# Patient Record
Sex: Male | Born: 1950 | Race: White | Hispanic: No | Marital: Single | Smoking: Never smoker
Health system: Southern US, Community
[De-identification: ages and names within clinical notes are randomized; demographics above are authoritative.]

## PROBLEM LIST (undated history)

## (undated) DIAGNOSIS — F329 Major depressive disorder, single episode, unspecified: Secondary | ICD-10-CM

## (undated) DIAGNOSIS — F32A Depression, unspecified: Secondary | ICD-10-CM

## (undated) DIAGNOSIS — N200 Calculus of kidney: Secondary | ICD-10-CM

## (undated) DIAGNOSIS — Z8249 Family history of ischemic heart disease and other diseases of the circulatory system: Secondary | ICD-10-CM

## (undated) DIAGNOSIS — E785 Hyperlipidemia, unspecified: Secondary | ICD-10-CM

## (undated) DIAGNOSIS — F988 Other specified behavioral and emotional disorders with onset usually occurring in childhood and adolescence: Secondary | ICD-10-CM

## (undated) DIAGNOSIS — K219 Gastro-esophageal reflux disease without esophagitis: Secondary | ICD-10-CM

## (undated) HISTORY — DX: Gastro-esophageal reflux disease without esophagitis: K21.9

## (undated) HISTORY — DX: Hyperlipidemia, unspecified: E78.5

## (undated) HISTORY — DX: Family history of ischemic heart disease and other diseases of the circulatory system: Z82.49

## (undated) HISTORY — DX: Calculus of kidney: N20.0

## (undated) HISTORY — DX: Depression, unspecified: F32.A

## (undated) HISTORY — DX: Major depressive disorder, single episode, unspecified: F32.9

## (undated) HISTORY — DX: Other specified behavioral and emotional disorders with onset usually occurring in childhood and adolescence: F98.8

---

## 2006-02-22 ENCOUNTER — Ambulatory Visit: Payer: Self-pay | Admitting: Family Medicine

## 2006-12-28 ENCOUNTER — Encounter: Admission: RE | Admit: 2006-12-28 | Discharge: 2006-12-28 | Payer: Self-pay | Admitting: Chiropractic Medicine

## 2007-04-06 HISTORY — PX: COLONOSCOPY: SHX174

## 2007-04-06 LAB — HM COLONOSCOPY: HM Colonoscopy: NORMAL

## 2007-12-22 ENCOUNTER — Ambulatory Visit: Payer: Self-pay | Admitting: Family Medicine

## 2008-04-04 ENCOUNTER — Ambulatory Visit: Payer: Self-pay | Admitting: *Deleted

## 2008-04-04 ENCOUNTER — Emergency Department (HOSPITAL_COMMUNITY): Admission: EM | Admit: 2008-04-04 | Discharge: 2008-04-04 | Payer: Self-pay | Admitting: Emergency Medicine

## 2008-04-08 ENCOUNTER — Ambulatory Visit: Payer: Self-pay | Admitting: Family Medicine

## 2008-04-22 ENCOUNTER — Ambulatory Visit: Payer: Self-pay | Admitting: Family Medicine

## 2008-05-06 ENCOUNTER — Ambulatory Visit: Payer: Self-pay | Admitting: Family Medicine

## 2008-05-13 ENCOUNTER — Ambulatory Visit: Payer: Self-pay | Admitting: Family Medicine

## 2008-07-10 ENCOUNTER — Ambulatory Visit: Payer: Self-pay | Admitting: Family Medicine

## 2008-09-20 ENCOUNTER — Ambulatory Visit: Payer: Self-pay | Admitting: Family Medicine

## 2008-09-23 ENCOUNTER — Ambulatory Visit: Payer: Self-pay | Admitting: Family Medicine

## 2008-09-23 ENCOUNTER — Encounter: Admission: RE | Admit: 2008-09-23 | Discharge: 2008-09-23 | Payer: Self-pay | Admitting: Family Medicine

## 2008-10-01 ENCOUNTER — Encounter: Payer: Self-pay | Admitting: Sports Medicine

## 2008-10-02 ENCOUNTER — Ambulatory Visit: Payer: Self-pay | Admitting: Sports Medicine

## 2008-10-02 DIAGNOSIS — M2170 Unequal limb length (acquired), unspecified site: Secondary | ICD-10-CM | POA: Insufficient documentation

## 2008-10-02 DIAGNOSIS — M25559 Pain in unspecified hip: Secondary | ICD-10-CM | POA: Insufficient documentation

## 2008-11-26 ENCOUNTER — Ambulatory Visit: Payer: Self-pay | Admitting: Family Medicine

## 2008-12-12 ENCOUNTER — Ambulatory Visit: Payer: Self-pay | Admitting: Family Medicine

## 2008-12-23 ENCOUNTER — Ambulatory Visit: Payer: Self-pay | Admitting: Family Medicine

## 2009-01-02 ENCOUNTER — Ambulatory Visit (HOSPITAL_COMMUNITY): Admission: RE | Admit: 2009-01-02 | Discharge: 2009-01-02 | Payer: Self-pay | Admitting: Family Medicine

## 2009-01-02 ENCOUNTER — Ambulatory Visit: Payer: Self-pay | Admitting: Family Medicine

## 2009-01-06 ENCOUNTER — Ambulatory Visit: Payer: Self-pay | Admitting: Family Medicine

## 2009-01-06 ENCOUNTER — Ambulatory Visit (HOSPITAL_COMMUNITY): Admission: RE | Admit: 2009-01-06 | Discharge: 2009-01-06 | Payer: Self-pay | Admitting: Orthopedic Surgery

## 2009-01-14 ENCOUNTER — Ambulatory Visit: Payer: Self-pay | Admitting: Infectious Disease

## 2009-01-14 ENCOUNTER — Ambulatory Visit (HOSPITAL_COMMUNITY): Admission: RE | Admit: 2009-01-14 | Discharge: 2009-01-15 | Payer: Self-pay | Admitting: Orthopedic Surgery

## 2009-01-15 ENCOUNTER — Ambulatory Visit: Payer: Self-pay | Admitting: Infectious Disease

## 2009-01-17 ENCOUNTER — Encounter: Payer: Self-pay | Admitting: Infectious Disease

## 2009-01-21 ENCOUNTER — Encounter: Payer: Self-pay | Admitting: Infectious Disease

## 2009-01-22 ENCOUNTER — Telehealth: Payer: Self-pay | Admitting: Infectious Disease

## 2009-01-23 ENCOUNTER — Encounter: Payer: Self-pay | Admitting: Infectious Disease

## 2009-01-24 ENCOUNTER — Encounter: Payer: Self-pay | Admitting: Infectious Disease

## 2009-01-28 ENCOUNTER — Encounter: Payer: Self-pay | Admitting: Infectious Disease

## 2009-02-04 ENCOUNTER — Encounter: Payer: Self-pay | Admitting: Infectious Disease

## 2009-02-17 ENCOUNTER — Ambulatory Visit: Payer: Self-pay | Admitting: Family Medicine

## 2009-02-24 ENCOUNTER — Ambulatory Visit: Payer: Self-pay | Admitting: Infectious Disease

## 2009-02-24 DIAGNOSIS — M869 Osteomyelitis, unspecified: Secondary | ICD-10-CM | POA: Insufficient documentation

## 2009-02-24 DIAGNOSIS — I498 Other specified cardiac arrhythmias: Secondary | ICD-10-CM

## 2009-02-24 DIAGNOSIS — L270 Generalized skin eruption due to drugs and medicaments taken internally: Secondary | ICD-10-CM | POA: Insufficient documentation

## 2009-12-20 IMAGING — CR DG CHEST 1V PORT
1 series · 1 of 1 positions shown · non-contrast
Comparison: 04/04/2008

CLINICAL DATA: PICC line placement

PORTABLE CHEST - 1 VIEW

[AP]
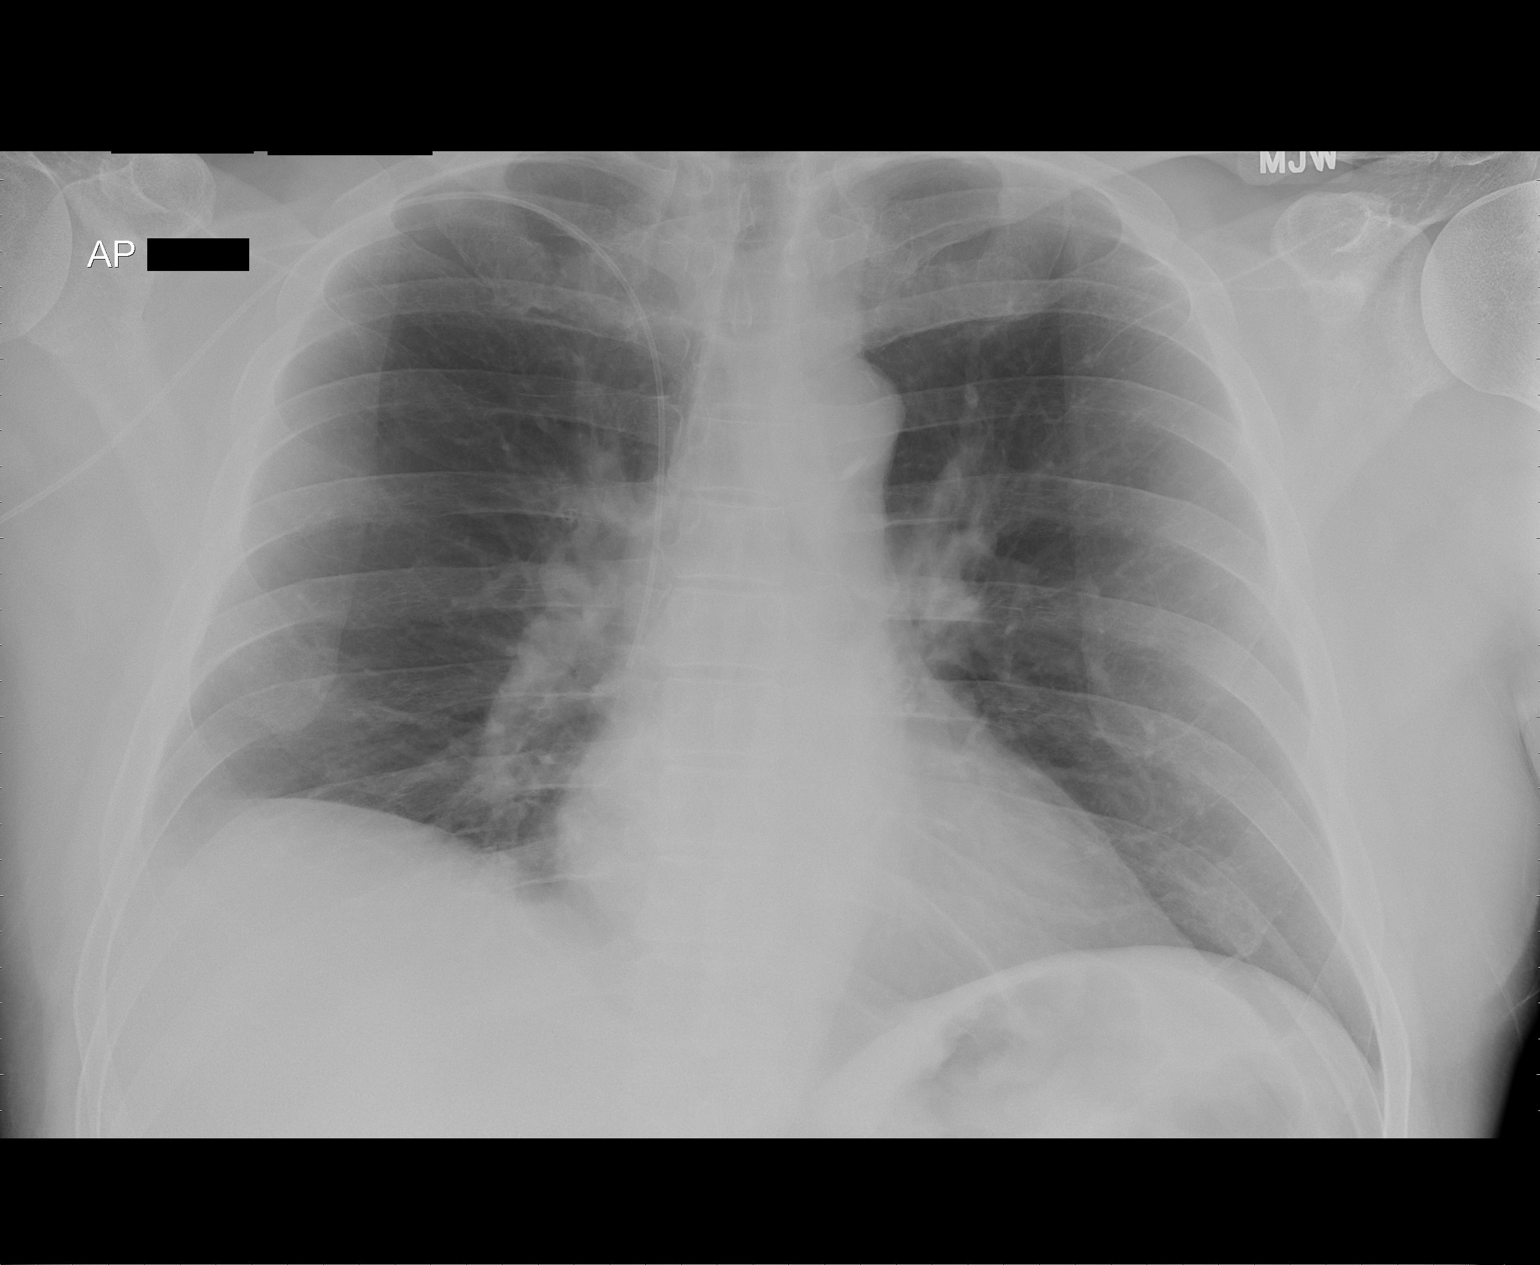

[1 of 1 positions shown; findings below may reference images not displayed]

FINDINGS: Right-sided PICC line has been placed with its tip at the
cavoatrial junction.  Heart size is normal.  Lungs are clear.  No
pneumothorax.  No pleural effusion.
IMPRESSION: Right-sided PICC line tip at cavoatrial junction.  No pneumothorax.

## 2010-04-26 ENCOUNTER — Encounter: Payer: Self-pay | Admitting: Family Medicine

## 2010-04-27 ENCOUNTER — Encounter: Payer: Self-pay | Admitting: Family Medicine

## 2010-07-09 LAB — COMPREHENSIVE METABOLIC PANEL
ALT: 19 U/L (ref 0–53)
AST: 18 U/L (ref 0–37)
CO2: 28 mEq/L (ref 19–32)
Calcium: 9.2 mg/dL (ref 8.4–10.5)
GFR calc Af Amer: >60 mL/min (ref >60)
Glucose, Bld: 102 mg/dL — ABNORMAL HIGH (ref 70–99)
Sodium: 141 mEq/L (ref 135–145)
Total Bilirubin: 0.8 mg/dL (ref 0.3–1.2)

## 2010-07-09 LAB — DIFFERENTIAL
Basophils Absolute: 0 10*3/uL (ref 0.0–0.1)
Basophils Relative: 0 % (ref 0–1)
Lymphocytes Relative: 23 % (ref 12–46)
Lymphs Abs: 2 10*3/uL (ref 0.7–4.0)
Monocytes Absolute: 0.6 10*3/uL (ref 0.1–1.0)
Neutro Abs: 5.9 10*3/uL (ref 1.7–7.7)
Neutrophils Relative %: 69 % (ref 43–77)

## 2010-07-09 LAB — ANAEROBIC CULTURE: Gram Stain: NONE SEEN

## 2010-07-09 LAB — CBC
Hemoglobin: 14.3 g/dL (ref 13.0–17.0)
Platelets: 170 10*3/uL (ref 150–400)
RDW: 13 % (ref 11.5–15.5)

## 2010-07-09 LAB — TISSUE CULTURE

## 2010-07-09 LAB — HIV-1 RNA QUANT-NO REFLEX-BLD
HIV 1 RNA Quant: <48 copies/mL (ref <48)
HIV-1 RNA Quant, Log: <1.68 {Log} (ref <1.68)

## 2010-07-09 LAB — AFB CULTURE WITH SMEAR (NOT AT ARMC)
Acid Fast Smear: NONE SEEN
Acid Fast Smear: NONE SEEN

## 2010-07-09 LAB — SEDIMENTATION RATE: Sed Rate: 9 mm/hr (ref 0–16)

## 2010-08-18 NOTE — Consult Note (Signed)
Kevin Harrington, CHAIRES NO.:  [id number]   MEDICAL RECORD NO.:  [id number]          PATIENT TYPE:  EMS   LOCATION:  [facility]                         FACILITY:  [facility]   PHYSICIAN:  Adela Ports, MD   DATE OF BIRTH:  05/15/1950   DATE OF CONSULTATION:  04/04/2008  DATE OF DISCHARGE:                                 CONSULTATION   PRIMARY CARE PHYSICIAN:  Sharlot Gowda, M.D.   REQUESTING PHYSICIAN:  Bethann Berkshire, M.D. a physician in the [facility]  emergency department.   CHIEF COMPLAINT:  Chest pain.   HISTORY OF THE PRESENT ILLNESS:  The patient is a 60 year old male with  a history of gastroesophageal reflux disease, dyslipidemia and a family  history of coronary disease.  He takes Protonix regularly for his GERD  and his prescription ran out about 3 days ago.  This afternoon he began  having increasing GERD symptoms in the afternoon and then at about 3:00  P.M. developed left chest pressure that was squeezing in nature.  He did  not any radiation, nausea, vomiting, diaphoresis, or short or shortness  of breath associated with this.  It was not associated with any position  and did not improve with changing positions.  It was not associated with  deep breaths or palpation.  He denies any dyspnea on exertion,  orthopnea, PND, edema, palpitations, presyncope or syncope, nor cough or  wheezing.  He has not had fever, chills or recent URI symptoms.   After this the squeezing chest pressure persisted for about 3 hours.  He  contacted his primary doctor in [facility] and was advised to come  into the emergency department for further evaluation.  In the emergency  department he received nitroglycerin x3 without improvement.  He also  received aspirin and Protonix.  Eventually his chest squeezing sensation  resolved thought he is not sure which interaction, if any, was  association with the improvement.  Currently he is chest pain-free.   PAST MEDICAL HISTORY:  1.  Dyslipidemia, currently on Lipitor.  2. Gastroesophageal reflux disease, treated with Protonix.  3. ADHD.  4. History of atypical chest pain a number of years ago with negative      stress test x3.  After continued chest discomfort despite negative      stress test he underwent cardiac catheterization in 2004, which was      reportedly negative.  He states that his chest discomfort at that      point was different than the chest pain that he felt today.   SOCIAL HISTORY:  The patient lives in [place].  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  He does not smoke, drink significantly or use any  drugs.   FAMILY HISTORY:  The family history is notable for a myocardial  infarction in his father at the age of 8; but, his mother and father  died of strokes.  He has two brothers neither of whom has significant  coronary disease.   REVIEW OF SYSTEMS:  The review of systems is as above; otherwise, he  denies his  review of systems is negative in detail.  Specifically he  denies fevers, chills, sweats, weight changes, adenopathy, shortness of  breath, dyspnea on exertion, orthopnea, PND, edema, palpitations,  presyncope, claudication, cough or wheezing, arthralgias or myalgias,  nausea, vomiting, diaphoresis, hematemesis, bright red blood per rectum,  dysphagia, odynophagia, or abdominal pain.  He does report that he often  gets GERD symptoms if he does not take his Protonix for 2 or 3 days.   CODE STATUS:  The patient's Code Status is full.   ALLERGIES:  MORPHINE CAUSES NAUSEA AND VOMITING.   MEDICATIONS:  1. Protonix 40 mg by mouth daily.  2. Lexapro daily.  3. Focalin daily.  4. Lipitor 20 mg daily.   PHYSICAL EXAMINATION:  VITAL SIGNS:  Temperature 98.5, pulse 62,  respiratory rate 16, blood pressure 123/76, and oxygen saturation 98% on  room air.  GENERAL APPEARANCE:  In general he is alert and oriented x3, and in no  acute distress.  HEENT:  Normocephalic and atraumatic head.   Pupils are equal, round,  react to light and accommodation.  Extraocular movements are intact.  Sclerae are clear.  NECK:  The neck is supple without lymphadenopathy, thyromegaly or  bruits.  JVD is 7 cmH2O with no evidence of Kussmaul sign.  HEART:  Cardiovascular exam - regular rate and rhythm.  Normal S1 and  S2.  No S3 or S4.  There are no murmurs appreciated and no rubs  appreciated.  Pulses are 2+ and equal without bruits bilaterally.  LUNGS:  The lungs are clear to auscultation bilaterally without  wheezing, rhonchi or rales.  SKIN:  The skin is without rash or lesions.  ABDOMEN:  The abdomen is soft, nontender and nondistended.  No rebound  or guarding.  Positive bowel sounds.  EXTREMITIES:  The extremities reveal no clubbing, cyanosis or edema.  MUSCULOSKELETAL:  The musculoskeletal exam is normal.  NEUROLOGIC:  Neuro exam is normal.   RADIOLOGIC DATA:  Chest x-ray showed no acute process.  EKG showed  normal sinus rhythm at a rate of 71, axis of 98 degrees, PR interval 24,  QRS  108 mL, QTC 435, there are some nonsignificant inferior Q-waves,  and there are also some nonspecific PR depressions and borderline  diffuse ST elevations raising the question of pericarditis or  nondiagnostic.  There is no prior EKG for comparison.   LABORATORY DATA:  The labs are notable for a white count of 13.1 with  62% polys, 26% lymphocytes, 9% monos, and hematocrit and platelets were  normal.  Metabolic panel including liver tests are quite unremarkable.  D-dimer was less than assay.  Lipase was normal.  Cardiac enzymes at  8:00 P.M. and at 9:50 P.M. were normal with MB 1.1 and 1.4, troponins  less than assay and myoglobins 59 and 57, which were both normal.   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  He has had negative  enzymes times two with the  most recent set being st least 6 or 7 hours  after the onset of his symptoms.  He has no electrocardiographic changes  suggestive of an acute coronary syndrome.  I suspect that this is likely  gastroesophageal reflux disease after stopping his Protonix.  My  suspicion for acute coronary syndrome is quite low.  He does have a  mildly elevated white blood count and some  borderline  electrocardiographic changes raising the question of pericarditis.  I  discussed this with him and recommended that he use aspirin for  recurrent chest pain if it is mild.  He has no evidence of volume  overload or tamponade clinically, and no rub on examination.    After discussion with the patient I feel he is safe for discharge.  He  is aware of symptoms to watch for; and, has stated that he will return  to the emergency department if he has increasing symptoms.  His primary  care doctor is aware that he came in tonight and the patient will  contact his primary care physician in the morning to discuss  scheduling a stress test given his history of cardiac risk factors, and  the fact that it has been more than 5 years since his cardiac  catheterization.  If he does return and especially if he develops any  shortness of breath or other symptoms, then an echocardiogram would be  reasonable to evaluate for possible complicated pericarditis.      Adela Ports, MD  Electronically Signed     DWM/MEDQ  D:  04/04/2008  T:  04/05/2008  Job:  [id number]

## 2011-01-08 LAB — CBC
MCHC: 33 g/dL (ref 30.0–36.0)
RBC: 5.02 MIL/uL (ref 4.22–5.81)
RDW: 13.3 % (ref 11.5–15.5)

## 2011-01-08 LAB — POCT CARDIAC MARKERS
CKMB, poc: 1.1 ng/mL (ref 1.0–8.0)
CKMB, poc: 1.4 ng/mL (ref 1.0–8.0)
Myoglobin, poc: 57.1 ng/mL (ref 12–200)
Troponin i, poc: <0.05 ng/mL (ref 0.00–0.09)

## 2011-01-08 LAB — COMPREHENSIVE METABOLIC PANEL
ALT: 27 U/L (ref 0–53)
AST: 23 U/L (ref 0–37)
CO2: 27 mEq/L (ref 19–32)
Calcium: 9.2 mg/dL (ref 8.4–10.5)
GFR calc Af Amer: >60 mL/min (ref >60)
Sodium: 137 mEq/L (ref 135–145)
Total Protein: 7.4 g/dL (ref 6.0–8.3)

## 2011-01-08 LAB — DIFFERENTIAL
Eosinophils Absolute: 0.2 10*3/uL (ref 0.0–0.7)
Eosinophils Relative: 2 % (ref 0–5)
Lymphs Abs: 3.4 10*3/uL (ref 0.7–4.0)
Monocytes Absolute: 1.2 10*3/uL — ABNORMAL HIGH (ref 0.1–1.0)
Monocytes Relative: 9 % (ref 3–12)

## 2011-07-14 ENCOUNTER — Encounter: Payer: Self-pay | Admitting: Internal Medicine

## 2011-07-15 ENCOUNTER — Ambulatory Visit (INDEPENDENT_AMBULATORY_CARE_PROVIDER_SITE_OTHER): Payer: BC Managed Care – PPO | Admitting: Family Medicine

## 2011-07-15 ENCOUNTER — Encounter: Payer: Self-pay | Admitting: Family Medicine

## 2011-07-15 VITALS — BP 130/80 | HR 78 | Temp 97.9°F | Resp 16 | Wt 187.0 lb

## 2011-07-15 DIAGNOSIS — E291 Testicular hypofunction: Secondary | ICD-10-CM

## 2011-07-15 DIAGNOSIS — N289 Disorder of kidney and ureter, unspecified: Secondary | ICD-10-CM

## 2011-07-15 DIAGNOSIS — D518 Other vitamin B12 deficiency anemias: Secondary | ICD-10-CM

## 2011-07-15 DIAGNOSIS — D531 Other megaloblastic anemias, not elsewhere classified: Secondary | ICD-10-CM

## 2011-07-15 NOTE — Progress Notes (Signed)
  Subjective:    Patient ID: Kevin Harrington, male    DOB: Mar 02, 1951, 61 y.o.   MRN: [id number]  HPI He is here for an interval evaluation. He has been living in [place] and recently moved back to this area. He was diagnosed with hypogonadism and had been on medication. He needs followup on this. He also was diagnosed with a vitamin B12 deficiency and was given injections. Recent blood work from December did show a slightly elevated creatinine as well as BUN. He thinks this could be more dehydration related. He also has stopped taking his Focalin as well as antidepressants and states that he feels that he is doing fairly well with this. 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   Review of Systems     Objective:   Physical Exam Alert and in no distress with appropriate affect and appropriately dressed       Assessment & Plan:   1. Hypogonadism male  Testosterone  2. Vitamin B12 deficient megaloblastic anemia  Vitamin B12  3. Abnormal renal function  Comprehensive metabolic panel   discussed the fact that the B12 can be taken orally. We will also reevaluate testosterone and look at his renal functions.

## 2011-07-16 LAB — COMPREHENSIVE METABOLIC PANEL
AST: 16 U/L (ref 0–37)
Alkaline Phosphatase: 91 U/L (ref 39–117)
BUN: 21 mg/dL (ref 6–23)
Calcium: 9.4 mg/dL (ref 8.4–10.5)
Chloride: 100 mEq/L (ref 96–112)
Creat: 1.2 mg/dL (ref 0.50–1.35)

## 2011-08-31 ENCOUNTER — Ambulatory Visit (INDEPENDENT_AMBULATORY_CARE_PROVIDER_SITE_OTHER): Payer: BC Managed Care – PPO | Admitting: Family Medicine

## 2011-08-31 ENCOUNTER — Encounter: Payer: Self-pay | Admitting: Family Medicine

## 2011-08-31 ENCOUNTER — Telehealth: Payer: Self-pay | Admitting: Internal Medicine

## 2011-08-31 VITALS — BP 138/72 | HR 98 | Wt 186.0 lb

## 2011-08-31 DIAGNOSIS — I10 Essential (primary) hypertension: Secondary | ICD-10-CM

## 2011-08-31 DIAGNOSIS — Z79899 Other long term (current) drug therapy: Secondary | ICD-10-CM

## 2011-08-31 DIAGNOSIS — Z8739 Personal history of other diseases of the musculoskeletal system and connective tissue: Secondary | ICD-10-CM

## 2011-08-31 DIAGNOSIS — R350 Frequency of micturition: Secondary | ICD-10-CM

## 2011-08-31 DIAGNOSIS — E291 Testicular hypofunction: Secondary | ICD-10-CM

## 2011-08-31 DIAGNOSIS — N200 Calculus of kidney: Secondary | ICD-10-CM

## 2011-08-31 DIAGNOSIS — Z8639 Personal history of other endocrine, nutritional and metabolic disease: Secondary | ICD-10-CM

## 2011-08-31 DIAGNOSIS — H9190 Unspecified hearing loss, unspecified ear: Secondary | ICD-10-CM

## 2011-08-31 LAB — POCT URINALYSIS DIPSTICK
Protein, UA: NEGATIVE
Spec Grav, UA: 1.02
Urobilinogen, UA: NEGATIVE
pH, UA: 5

## 2011-08-31 LAB — TESTOSTERONE: Testosterone: 148.15 ng/dL — ABNORMAL LOW (ref 300–890)

## 2011-08-31 MED ORDER — TERAZOSIN HCL 1 MG PO CAPS: 1.0000 mg | ORAL_CAPSULE | Freq: Every day | ORAL | Status: DC

## 2011-08-31 MED ORDER — TESTOSTERONE 20.25 MG/1.25GM (1.62%) TD GEL: 2.0000 | Freq: Every day | TRANSDERMAL | Status: DC

## 2011-08-31 MED ORDER — TESTOSTERONE 50 MG/5GM (1%) TD GEL: 5.0000 g | Freq: Every day | TRANSDERMAL | Status: DC

## 2011-08-31 NOTE — Telephone Encounter (Signed)
Call in the AndroGel 2 pumps daily.

## 2011-08-31 NOTE — Patient Instructions (Signed)
Tried a new medicine and let me know how works. Stay on the testosterone. Call me if you need your gout medicine

## 2011-08-31 NOTE — Telephone Encounter (Signed)
Called androgel in per jcl 

## 2011-08-31 NOTE — Progress Notes (Signed)
  Subjective:    Patient ID: Kevin Harrington, male    DOB: [birth year], 61 y.o.   MRN: [id number]  HPI He is here for an interval evaluation. He does complain of difficulty with urinary frequency as well as urgency but no hesitancy, decreased stream. He has nocturia times one. He has a previous history of renal stones and presently is on HCTZ to help with this. He is also noted decreased hearing in the left ear but no pain. He is also had intermittent abdominal distress mainly in the morning. Presently he is on Protonix. He was diagnosed recently with a gout attack and given colchicine which did take his symptoms away fairly quickly. He has been using Testim but in very small doses making one tube last an entire week.   Review of Systems     Objective:   Physical Exam alert and in no distress. Tympanic membranes and canals are normal. Throat is clear. Tonsils are normal. Neck is supple without adenopathy or thyromegaly. Cardiac exam shows a regular sinus rhythm without murmurs or gallops. Lungs are clear to auscultation. Rectal exam shows a normal sized prostate. Hearing screening was negative. Urinalysis normal.        Assessment & Plan:   1. Frequency of micturition  terazosin (HYTRIN) 1 MG capsule, POCT Urinalysis Dipstick  2. Hypogonadism male  Testosterone, PSA, DISCONTINUED: testosterone (TESTIM) 50 MG/5GM GEL  3. History of gout    4. Hypertension    5. Decreased hearing    6. Encounter for long-term (current) use of other medications    7. Renal stones    8    GERD                                                                continue Protonix and monitor the abdominal distress. He is to call me and let he know how the Hytrin is working. Continue on all of the medications. He has a gout attack, he will call. His insurance will apparently not covered tested and he will be switched to AndroGel.

## 2011-10-22 ENCOUNTER — Other Ambulatory Visit: Payer: Self-pay | Admitting: Family Medicine

## 2011-10-22 MED ORDER — TERAZOSIN HCL 2 MG PO CAPS: 2.0000 mg | ORAL_CAPSULE | Freq: Every day | ORAL | Status: DC

## 2011-12-24 ENCOUNTER — Telehealth: Payer: Self-pay | Admitting: Family Medicine

## 2011-12-24 NOTE — Telephone Encounter (Signed)
LM

## 2012-01-10 ENCOUNTER — Encounter: Payer: Self-pay | Admitting: Family Medicine

## 2012-01-10 ENCOUNTER — Ambulatory Visit (INDEPENDENT_AMBULATORY_CARE_PROVIDER_SITE_OTHER): Payer: BC Managed Care – PPO | Admitting: Family Medicine

## 2012-01-10 VITALS — BP 126/78 | HR 106 | Wt 184.0 lb

## 2012-01-10 DIAGNOSIS — Z2911 Encounter for prophylactic immunotherapy for respiratory syncytial virus (RSV): Secondary | ICD-10-CM

## 2012-01-10 DIAGNOSIS — F329 Major depressive disorder, single episode, unspecified: Secondary | ICD-10-CM

## 2012-01-10 DIAGNOSIS — E785 Hyperlipidemia, unspecified: Secondary | ICD-10-CM

## 2012-01-10 DIAGNOSIS — R079 Chest pain, unspecified: Secondary | ICD-10-CM

## 2012-01-10 DIAGNOSIS — Z23 Encounter for immunization: Secondary | ICD-10-CM

## 2012-01-10 LAB — LIPID PANEL
Cholesterol: 189 mg/dL (ref 0–200)
LDL Cholesterol: 119 mg/dL — ABNORMAL HIGH (ref 0–99)
VLDL: 33 mg/dL (ref 0–40)

## 2012-01-10 MED ORDER — ATORVASTATIN CALCIUM 20 MG PO TABS: 20.0000 mg | ORAL_TABLET | Freq: Every day | ORAL | Status: DC

## 2012-01-10 NOTE — Patient Instructions (Addendum)
Hold both the lisinopril and Hytrin and keep track of your blood pressure and then let me know

## 2012-01-10 NOTE — Progress Notes (Signed)
  Subjective:    Patient ID: Kevin Harrington, male    DOB: 1950/07/24, 61 y.o.   MRN: [id number]  HPI He is here for consultation concerning chest pain. This has been going on for several months and is intermittent in nature. He describes it as aching but no associated shortness of breath, diaphoresis or weakness. Specifically he can use the elliptical machine without difficulty. He has a previous history of difficulty with chest pain and in the past has had a stress test which was negative as well as catheterization in 2000 which was negative. Family history positive for father having an MI at age 12. He cannot relate this distress, eating or position. On September 15 he woke up with a severe left temporal headache and did go to the emergency room. The blood work and CT scan were reviewed. Blood work showed slight hypokalemia at 3.2 and the CT did show some mild atrophy. He has not had any symptoms since then. Several years ago he was placed on lisinopril/HCTZ. His readings in were 140/80. Since then he has noted polyuria and would like to stop the medicine. He also recently stopped taking Hytrin. He is also scheduled to see a psychiatrist in [place]. He is still having suicidal ideation.   Review of Systems  Constitutional: Negative.   HENT: Negative.   Respiratory: Negative.   Cardiovascular: Positive for chest pain.  Gastrointestinal: Negative.   Musculoskeletal: Negative.   Skin: Negative.   Hematological: Negative.   Psychiatric/Behavioral: Positive for suicidal ideas. The patient is nervous/anxious.        Objective:   Physical Exam alert and in no distress. Tympanic membranes and canals are normal. Throat is clear. Tonsils are normal. Neck is supple without adenopathy or thyromegaly. Cardiac exam shows a regular sinus rhythm without murmurs or gallops. Lungs are clear to auscultation. Abdominal exam shows no asses or tenderness with normal bowel sounds. DTRs normal.         Assessment & Plan:   1. Need for prophylactic vaccination and inoculation against influenza  Flu vaccine greater than or equal to 3yo preservative free IM  2. Hyperlipidemia LDL goal < 100  Lipid panel, atorvastatin (LIPITOR) 20 MG tablet  3. Chest pain    4. Depression     flu shot and Zostavax given. Discussed possible side effects and benefits. He is to see cardiology today. Did recommend that once he gets stable on the medication from a psychiatrist, I will continue him on this. He will also let me know his blood pressure readings since we are stopping these medicines.

## 2012-01-11 ENCOUNTER — Other Ambulatory Visit: Payer: Self-pay | Admitting: Cardiology

## 2012-01-11 DIAGNOSIS — I7090 Unspecified atherosclerosis: Secondary | ICD-10-CM

## 2012-01-11 DIAGNOSIS — E785 Hyperlipidemia, unspecified: Secondary | ICD-10-CM

## 2012-01-11 DIAGNOSIS — K219 Gastro-esophageal reflux disease without esophagitis: Secondary | ICD-10-CM

## 2012-01-11 NOTE — Progress Notes (Signed)
Kevin Harrington    Date of visit:  01/10/2012 DOB:  1950/08/25    Age:  61 yrs. Medical record number:  [id number]     Account number:  [id number] Primary Care Provider: Sharlot Gowda C ____________________________ CURRENT DIAGNOSES  1. Chest Pain  2. Hyperlipidemia  3. Atherosclerosis Vascular Disease  4. GERD ____________________________ ALLERGIES  Compazine  doxycycline hyclate  morphine, Nausea  Vancomycin ____________________________ MEDICATIONS  1. Lipitor 20 mg tablet, 1 p.o. daily  2. Protonix 40 mg tablet,delayed release (DR/EC), 1 p.o. daily  3. aspirin 81 mg tablet, chewable, 1 p.o. daily ____________________________ CHIEF COMPLAINTS  Chest pain ____________________________ HISTORY OF PRESENT ILLNESS  Patient seen at the request of Dr. Sharlot Gowda for evaluation of chest pain. 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 He had a catheterization done several years ago when he was living in [place] that was reportedly negative. He had an episode of syncope previously and had a tilt table and had a stress echo that was done several years ago because of some chest pain in [place]. He has had mild hypertension and also takes Lipitor for mild hyperlipidemia. He has been diagnosed with hypogonadism. He had a rod removed from his leg several years ago and there was a question of osteomyelitis at the time although it is hard to tell if this was an actual diagnosis or not.  He currently lives in [place] but gets his primary care through Dr. Susann Givens. Several weeks ago he began to experience left upper chest discomfort not associated with exercise. He was described as upper left chest pain is not pleuritic and described as an aching pain that occurred. His times. He was not associated with sweating or shortness of breath. He has not had any pain in the last 3 weeks now. He did have a CT scan done several years ago but did show some  atherosclerotic changes in his distal aorta. He has a prior history of depression and is due to see a psychiatrist in the [place] area causes some ongoing issues soon. He does have a history of hypertension in the past and was on lisinopril/HCTZ but because his blood pressure has been low is planning on stopping that today. He also has some mild hypogonadism. ____________________________ PAST HISTORY  Past Medical Illnesses:  hyperlipidemia, GERD, hypertension, history of kidney stones;  Cardiovascular Illnesses:  no previous history of cardiac disease.;  Surgical Procedures:  testicle surg, l leg surg, lithotripsy;  Cardiology Procedures-Invasive:  cardiac cath (left);  Cardiology Procedures-Noninvasive:  echocardiogram, tilt table, stress echocardiogram, treadmill cardiolite;  LVEF not documented ____________________________ CARDIO-PULMONARY TEST DATES EKG Date:  01/10/2012;   Cardiac Cath Date:  02/03/1998;  Nuclear Study Date:  03/05/2009;   ____________________________ FAMILY HISTORY Father - age 20,  died of CVA,  history of CAD and  hypertension; Mother - age 9,  died of CVA and  hypertension; Brother 1 - age 73,  alive and well and  hypertension; Brother 2 - age 91,  alive and well and  hypertension;  ____________________________ SOCIAL HISTORY Alcohol Use:  1 q month;  Smoking:  never smoked;  Diet:  regular diet;  Lifestyle:  single;  Exercise:  aerobics 3 days per week;  Occupation:  physician;  Residence:  lives alone;   ____________________________ REVIEW OF SYSTEMS General:  denies recent weight change, fatique or change in exercise tolerance.Integumentary:  no rashes or new skin lesions.Eyes:  denies diplopia, history of glaucoma or  visual problems.Ears, Nose, Throat, Mouth:  denies any hearing loss, epistaxis, hoarseness or difficulty speaking. Respiratory:  denies dyspnea, cough, wheezing or hemoptysis. Cardiovascular:  please review HPI Abdominal:  history of  GERD Genitourinary-Male:  no dysuria, urgency, frequency, or nocturia  Musculoskeletal:  denies arthritis, venous insufficiency, or muscle weakness.  Neurological:  occasional headaches  Psychiatric:  depression  Hematological/Immunologic:  denies any food allergies, bleeding disorders. ____________________________ PHYSICAL EXAMINATION VITAL SIGNS  Blood Pressure:  124/80 Sitting, Left arm, regular cuff  , 118/78 Standing, Left arm and regular cuff   Pulse:  68/min. Weight:  184.00 lbs. Height:  70"BMI: 26  Constitutional:  pleasant white male in no acute distress Skin:  warm and dry to touch, no apparent skin lesions, or masses noted. Head:  normocephalic, balding male hair pattern Eyes:  EOMS Intact, PERRLA, C and S clear, Funduscopic exam not done. ENT:  ears, nose and throat reveal no gross abnormalities.  Dentition good. Neck:  supple, without massess. No JVD, thyromegaly or carotid bruits. Carotid upstroke normal. Chest:  normal symmetry, clear to auscultation and percussion. Cardiac:  regular rhythm, normal S1 and S2, No S3 or S4, no murmurs, gallops or rubs detected. Abdomen:  abdomen soft,non-tender, no masses, no hepatospenomegaly, or aneurysm noted Peripheral Pulses:  the femoral,dorsalis pedis, and posterior tibial pulses are full and equal bilaterally with no bruits auscultated. Extremities & Back:  no deformities, clubbing, cyanosis, erythema or edema observed. Normal muscle strength and tone. Neurological:  no gross motor or sensory deficits noted, affect appropriate, oriented x3. ____________________________ IMPRESSIONS/PLAN  1. Chest pain which is atypical for myocardial ischemia 2. Atherosclerotic vascular disease as manifested on an abdominal CT scan 3. Vague history of hypertension currently normotensive today 4. Hyperlipidemia under treatment  Recommendations:  We discussed the atherosclerotic vascular disease manifested on CT scan. He does have a family history of  cardiac disease. I don't think the chest pain is cardiac by description. His EKG is normal. I would recommend he have a treadmill test to make sure he does not have a high risk situation for ischemia. His LDL goal should be less than 70. He should have a systolic blood pressure goal of less than 130. He should continue to exercise and abstain from smoking. ____________________________ TODAYS ORDERS  1. 12 Lead EKG: Today  2. treadmill:  Regular TM At [facility] [employer]                       ____________________________ Cardiology Physician:  Darden Palmer. MD [facility]

## 2012-01-12 MED ORDER — ATORVASTATIN CALCIUM 40 MG PO TABS: 40.0000 mg | ORAL_TABLET | Freq: Every day | ORAL | Status: DC

## 2012-01-12 NOTE — Addendum Note (Signed)
Addended by: Sharlot Gowda C on: 01/12/2012 09:00 AM   Modules accepted: Orders

## 2012-08-10 ENCOUNTER — Other Ambulatory Visit: Payer: Self-pay

## 2012-08-10 MED ORDER — PANTOPRAZOLE SODIUM 40 MG PO TBEC: 40.0000 mg | DELAYED_RELEASE_TABLET | Freq: Every day | ORAL | Status: DC

## 2012-08-10 MED ORDER — TESTOSTERONE 20.25 MG/1.25GM (1.62%) TD GEL: 2.0000 | Freq: Every day | TRANSDERMAL | Status: AC

## 2012-08-10 NOTE — Telephone Encounter (Signed)
CALLED IN MEDS PER JCL

## 2012-08-14 ENCOUNTER — Telehealth: Payer: Self-pay | Admitting: Family Medicine

## 2012-08-14 NOTE — Telephone Encounter (Signed)
Pt called and wanted rx Androgel and Protonix called to [facility] in [facility].  done

## 2012-09-19 ENCOUNTER — Encounter: Payer: Self-pay | Admitting: Family Medicine

## 2013-01-05 ENCOUNTER — Other Ambulatory Visit: Payer: Self-pay | Admitting: Family Medicine

## 2013-04-03 ENCOUNTER — Encounter: Payer: Self-pay | Admitting: Family Medicine

## 2013-04-03 ENCOUNTER — Ambulatory Visit (INDEPENDENT_AMBULATORY_CARE_PROVIDER_SITE_OTHER): Payer: BC Managed Care – PPO | Admitting: Family Medicine

## 2013-04-03 VITALS — BP 112/74 | HR 94 | Wt 186.0 lb

## 2013-04-03 DIAGNOSIS — N200 Calculus of kidney: Secondary | ICD-10-CM

## 2013-04-03 DIAGNOSIS — E785 Hyperlipidemia, unspecified: Secondary | ICD-10-CM

## 2013-04-03 DIAGNOSIS — I7090 Unspecified atherosclerosis: Secondary | ICD-10-CM

## 2013-04-03 DIAGNOSIS — D518 Other vitamin B12 deficiency anemias: Secondary | ICD-10-CM

## 2013-04-03 DIAGNOSIS — Z79899 Other long term (current) drug therapy: Secondary | ICD-10-CM

## 2013-04-03 DIAGNOSIS — Z23 Encounter for immunization: Secondary | ICD-10-CM

## 2013-04-03 DIAGNOSIS — K219 Gastro-esophageal reflux disease without esophagitis: Secondary | ICD-10-CM

## 2013-04-03 DIAGNOSIS — Z8739 Personal history of other diseases of the musculoskeletal system and connective tissue: Secondary | ICD-10-CM

## 2013-04-03 DIAGNOSIS — E291 Testicular hypofunction: Secondary | ICD-10-CM

## 2013-04-03 DIAGNOSIS — Z862 Personal history of diseases of the blood and blood-forming organs and certain disorders involving the immune mechanism: Secondary | ICD-10-CM

## 2013-04-03 DIAGNOSIS — D531 Other megaloblastic anemias, not elsewhere classified: Secondary | ICD-10-CM

## 2013-04-03 DIAGNOSIS — Z0000 Encounter for general adult medical examination without abnormal findings: Secondary | ICD-10-CM

## 2013-04-03 DIAGNOSIS — I10 Essential (primary) hypertension: Secondary | ICD-10-CM

## 2013-04-03 LAB — COMPREHENSIVE METABOLIC PANEL
ALT: 23 U/L (ref 0–53)
Albumin: 4.4 g/dL (ref 3.5–5.2)
Alkaline Phosphatase: 94 U/L (ref 39–117)
CO2: 29 mEq/L (ref 19–32)
Chloride: 99 mEq/L (ref 96–112)
Glucose, Bld: 88 mg/dL (ref 70–99)
Potassium: 3.9 mEq/L (ref 3.5–5.3)
Sodium: 138 mEq/L (ref 135–145)
Total Protein: 7.8 g/dL (ref 6.0–8.3)

## 2013-04-03 LAB — CBC WITH DIFFERENTIAL/PLATELET
Hemoglobin: 15.1 g/dL (ref 13.0–17.0)
Lymphocytes Relative: 24 % (ref 12–46)
Lymphs Abs: 2.7 10*3/uL (ref 0.7–4.0)
Monocytes Relative: 9 % (ref 3–12)
Neutro Abs: 7.5 10*3/uL (ref 1.7–7.7)
Neutrophils Relative %: 64 % (ref 43–77)
Platelets: 238 10*3/uL (ref 150–400)
RBC: 5.12 MIL/uL (ref 4.22–5.81)
WBC: 11.6 10*3/uL — ABNORMAL HIGH (ref 4.0–10.5)

## 2013-04-03 LAB — LIPID PANEL
Cholesterol: 196 mg/dL (ref 0–200)
LDL Cholesterol: 122 mg/dL — ABNORMAL HIGH (ref 0–99)
Triglycerides: 125 mg/dL (ref <150)
VLDL: 25 mg/dL (ref 0–40)

## 2013-04-03 NOTE — Progress Notes (Signed)
Subjective:    Patient ID: Kevin Harrington, male    DOB: 24-Sep-1950, 62 y.o.   MRN: [id number]  HPI He is here for complete examination. He continues to take medications for his low testosterone. He has had no difficulty with this. He continues on his blood pressure medications as well as statin drugs. He is followed by a a psychiatrist in [place] for his depression. He's had no difficulty with renal stones. He has an x-ray diagnosis of atherosclerosis but has had no chest pain, shortness of breath. His reflux is under good control. Apparently he did have difficulty with a B12 deficiency in the past and would like followup on this. He has a previous history of gout but has not had an attack in several years. He was given allopurinol but has not taken it. 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 Family and social history was reviewed and is unchanged.  Review of Systems Negative except as above    Objective:   Physical Exam BP 112/74  Pulse 94  Wt 186 lb (84.369 kg)  SpO2 99%  General Appearance:    Alert, cooperative, no distress, appears stated age  Head:    Normocephalic, without obvious abnormality, atraumatic  Eyes:    PERRL, conjunctiva/corneas clear, EOM's intact, fundi    benign  Ears:    Normal TM's and external ear canals  Nose:   Nares normal, mucosa normal, no drainage or sinus   Tenderness.healing lesion noted on the tip of the nose.   Throat:   Lips, mucosa, and tongue normal; teeth and gums normal  Neck:   Supple, no lymphadenopathy;  thyroid:  no   enlargement/tenderness/nodules; no carotid   bruit or JVD  Back:    Spine nontender, no curvature, ROM normal, no CVA     tenderness  Lungs:     Clear to auscultation bilaterally without wheezes, rales or     ronchi; respirations unlabored  Chest Wall:    No tenderness or deformity   Heart:    Regular rate and  rhythm, S1 and S2 normal, no murmur, rub   or gallop  Breast Exam:    No chest wall tenderness, masses or gynecomastia  Abdomen:     Soft, non-tender, nondistended, normoactive bowel sounds,    no masses, no hepatosplenomegaly     Rectal:    Normal sphincter tone, no masses or tenderness; guaiac negative stool.  Prostate smooth, no nodules, not enlarged.  Extremities:   No clubbing, cyanosis or edema  Pulses:   2+ and symmetric all extremities  Skin:   Skin color, texture, turgor normal, no rashes or lesions  Lymph nodes:   Cervical, supraclavicular, and axillary nodes normal  Neurologic:   CNII-XII intact, normal strength, sensation and gait; reflexes 2+ and symmetric throughout          Psych:   Normal mood, affect, hygiene and grooming.          Assessment & Plan:  Routine general medical examination at a health care facility - Plan: Flu Vaccine QUAD 36+ mos IM  Hypogonadism male - Plan: Testosterone, PSA  Hypertension  Hyperlipidemia - Plan: Lipid panel  GERD (gastroesophageal reflux disease)  History of gout  Atherosclerosis  Renal stones  Need for prophylactic vaccination and inoculation against influenza  Encounter for long-term (current) use of other medications -  Plan: CBC with Differential, Comprehensive metabolic panel, Lipid panel, Testosterone, PSA  Vitamin B12 deficient megaloblastic anemia - Plan: Vitamin B12

## 2013-04-04 LAB — PSA: PSA: 0.96 ng/mL (ref <=4.00)

## 2013-04-04 LAB — VITAMIN B12: Vitamin B-12: 438 pg/mL (ref 211–911)

## 2013-04-04 LAB — TESTOSTERONE: Testosterone: 254 ng/dL — ABNORMAL LOW (ref 300–890)

## 2013-04-09 ENCOUNTER — Telehealth: Payer: Self-pay | Admitting: Family Medicine

## 2013-04-09 MED ORDER — LISINOPRIL-HYDROCHLOROTHIAZIDE 20-12.5 MG PO TABS: 1.0000 | ORAL_TABLET | Freq: Every day | ORAL | Status: DC

## 2013-04-09 MED ORDER — AMLODIPINE BESYLATE 5 MG PO TABS: 5.0000 mg | ORAL_TABLET | Freq: Every day | ORAL | Status: DC

## 2013-04-09 NOTE — Telephone Encounter (Signed)
Pt called and left message on voice mail that his pharmacy is [facility] in [address] [place], [place]  [phone].  Also he gets 90 day supply

## 2013-04-09 NOTE — Telephone Encounter (Signed)
Call or e-mail in the amlodipine and lisinopril 90 day supply ,3 refills

## 2013-04-09 NOTE — Telephone Encounter (Signed)
Sent in meds 

## 2013-07-01 ENCOUNTER — Other Ambulatory Visit: Payer: Self-pay | Admitting: Family Medicine

## 2013-07-16 ENCOUNTER — Telehealth: Payer: Self-pay | Admitting: Family Medicine

## 2013-07-16 NOTE — Telephone Encounter (Signed)
Pt called and states he [facility] and they do not have.  So I called it into [phone] [place]  Then I informed Tim [phone] of renewal.

## 2013-07-17 ENCOUNTER — Telehealth: Payer: Self-pay | Admitting: Family Medicine

## 2013-07-18 NOTE — Telephone Encounter (Signed)
Go ahead and call this in 

## 2013-07-19 ENCOUNTER — Other Ambulatory Visit: Payer: Self-pay

## 2013-07-19 MED ORDER — SILDENAFIL CITRATE 100 MG PO TABS: 100.0000 mg | ORAL_TABLET | Freq: Every day | ORAL | Status: AC | PRN

## 2013-07-19 NOTE — Telephone Encounter (Signed)
Medication called in on 07/02/13, I called the pharmacy to verify that it had been called in and pharmacist states that this medication is $400 with pt insurance, i will call and make pt aware. Please advise.

## 2013-07-20 NOTE — Telephone Encounter (Signed)
Pt informed.  He also states his ins will be changing 09/03/13, [employer] for medical & [employer] for prescriptions.  He will call back at that time with new ins info.

## 2013-07-20 NOTE — Telephone Encounter (Signed)
Called pharmacy about Viagra he is allowed to have #4 per 29 days for $142.86, left message for pt

## 2013-08-06 ENCOUNTER — Other Ambulatory Visit: Payer: Self-pay | Admitting: Family Medicine

## 2013-08-12 ENCOUNTER — Other Ambulatory Visit: Payer: Self-pay | Admitting: Family Medicine

## 2013-08-13 ENCOUNTER — Telehealth: Payer: Self-pay | Admitting: Family Medicine

## 2013-08-13 NOTE — Telephone Encounter (Signed)
lm

## 2014-01-07 ENCOUNTER — Encounter: Payer: Self-pay | Admitting: Family Medicine

## 2014-01-07 ENCOUNTER — Ambulatory Visit (INDEPENDENT_AMBULATORY_CARE_PROVIDER_SITE_OTHER): Payer: Medicare Other | Admitting: Family Medicine

## 2014-01-07 VITALS — BP 122/82 | HR 76 | Ht 69.5 in | Wt 185.0 lb

## 2014-01-07 DIAGNOSIS — N200 Calculus of kidney: Secondary | ICD-10-CM

## 2014-01-07 DIAGNOSIS — I7090 Unspecified atherosclerosis: Secondary | ICD-10-CM

## 2014-01-07 DIAGNOSIS — K219 Gastro-esophageal reflux disease without esophagitis: Secondary | ICD-10-CM

## 2014-01-07 DIAGNOSIS — R6889 Other general symptoms and signs: Secondary | ICD-10-CM

## 2014-01-07 DIAGNOSIS — G479 Sleep disorder, unspecified: Secondary | ICD-10-CM

## 2014-01-07 DIAGNOSIS — E291 Testicular hypofunction: Secondary | ICD-10-CM

## 2014-01-07 DIAGNOSIS — I10 Essential (primary) hypertension: Secondary | ICD-10-CM

## 2014-01-07 DIAGNOSIS — Z8639 Personal history of other endocrine, nutritional and metabolic disease: Secondary | ICD-10-CM

## 2014-01-07 DIAGNOSIS — Z23 Encounter for immunization: Secondary | ICD-10-CM

## 2014-01-07 DIAGNOSIS — E785 Hyperlipidemia, unspecified: Secondary | ICD-10-CM

## 2014-01-07 DIAGNOSIS — Z8739 Personal history of other diseases of the musculoskeletal system and connective tissue: Secondary | ICD-10-CM

## 2014-01-07 LAB — CBC WITH DIFFERENTIAL/PLATELET
Basophils Absolute: 0 10*3/uL (ref 0.0–0.1)
Basophils Relative: 0 % (ref 0–1)
Eosinophils Absolute: 0.1 10*3/uL (ref 0.0–0.7)
Eosinophils Relative: 1 % (ref 0–5)
HCT: 42.9 % (ref 39.0–52.0)
Hemoglobin: 15 g/dL (ref 13.0–17.0)
Lymphocytes Relative: 21 % (ref 12–46)
Lymphs Abs: 2.8 10*3/uL (ref 0.7–4.0)
MCH: 29 pg (ref 26.0–34.0)
MCHC: 35 g/dL (ref 30.0–36.0)
MCV: 83 fL (ref 78.0–100.0)
Monocytes Absolute: 1.1 10*3/uL — ABNORMAL HIGH (ref 0.1–1.0)
Monocytes Relative: 8 % (ref 3–12)
Neutro Abs: 9.2 10*3/uL — ABNORMAL HIGH (ref 1.7–7.7)
Neutrophils Relative %: 70 % (ref 43–77)
Platelets: 227 10*3/uL (ref 150–400)
RBC: 5.17 MIL/uL (ref 4.22–5.81)
RDW: 13.5 % (ref 11.5–15.5)
WBC: 13.2 10*3/uL — ABNORMAL HIGH (ref 4.0–10.5)

## 2014-01-07 MED ORDER — TRAZODONE HCL 50 MG PO TABS: ORAL_TABLET | ORAL | Status: DC

## 2014-01-07 MED ORDER — LISINOPRIL-HYDROCHLOROTHIAZIDE 20-12.5 MG PO TABS: 1.0000 | ORAL_TABLET | Freq: Every day | ORAL | Status: DC

## 2014-01-07 MED ORDER — AMLODIPINE BESYLATE 5 MG PO TABS: 5.0000 mg | ORAL_TABLET | Freq: Every day | ORAL | Status: DC

## 2014-01-07 MED ORDER — ATORVASTATIN CALCIUM 40 MG PO TABS: 40.0000 mg | ORAL_TABLET | Freq: Every day | ORAL | Status: DC

## 2014-01-07 NOTE — Progress Notes (Signed)
Subjective:    Patient ID: Kevin Harrington, male    DOB: [birth year], 63 y.o.   MRN: [id number]  HPI He is here for complete examination. He continues on his blood pressure medications and is having no difficulty with this. He also takes Lipitor. He is concerned over some central body fat but does exercise fairly regularly. His reflux is under good control. He has not had any difficulty with gout or renal stones. He had no chest pain, shortness of breath, leg pains. He does have a previous history of hypogonadism has been off testosterone for quite some time. He also complains of some cold intolerance. He does 25 mg of trazodone to help with sleep. He was given that by a psychiatrist and would like to continue on that. He does complain of some urinary urgency. 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 Family and social history was reviewed. His immunizations and health maintenance for also reviewed.   Review of Systems  All other systems reviewed and are negative.      Objective:   Physical Exam BP 122/82  Pulse 76  Ht 5' 9.5" (1.765 m)  Wt 185 lb (83.915 kg)  BMI 26.94 kg/m2  General Appearance:    Alert, cooperative, no distress, appears stated age  Head:    Normocephalic, without obvious abnormality, atraumatic  Eyes:    PERRL, conjunctiva/corneas clear, EOM's intact, fundi    benign  Ears:    Normal TM's and external ear canals  Nose:   Nares normal, mucosa normal, no drainage or sinus   tenderness  Throat:   Lips, mucosa, and tongue normal; teeth and gums normal  Neck:   Supple, no lymphadenopathy;  thyroid:  no   enlargement/tenderness/nodules; no carotid   bruit or JVD  Back:    Spine nontender, no curvature, ROM normal, no CVA     tenderness  Lungs:     Clear to auscultation bilaterally without wheezes, rales or     ronchi; respirations unlabored  Chest Wall:    No tenderness or deformity   Heart:    Regular rate and rhythm, S1  and S2 normal, no murmur, rub   or gallop  Breast Exam:    No chest wall tenderness, masses or gynecomastia  Abdomen:     Soft, non-tender, nondistended, normoactive bowel sounds,    no masses, no hepatosplenomegaly  Genitalia:    Normal male external genitalia without lesions.  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  No inguinal hernias.  Rectal:    Normal sphincter tone, no masses or tenderness; guaiac negative stool.  Prostate smooth, no nodules, not enlarged.  Extremities:   No clubbing, cyanosis or edema  Pulses:   2+ and symmetric all extremities  Skin:   Skin color, texture, turgor normal, no rashes or lesions  Lymph nodes:   Cervical, supraclavicular, and axillary nodes normal  Neurologic:   CNII-XII intact, normal strength, sensation and gait; reflexes 2+ and symmetric throughout          Psych:   Normal mood, affect, hygiene and grooming.          Assessment & Plan:  Hypogonadism male - Plan: Testosterone  Essential hypertension - Plan: CBC with Differential, Comprehensive metabolic panel, lisinopril-hydrochlorothiazide (PRINZIDE,ZESTORETIC) 20-12.5 MG per tablet, amLODipine (NORVASC) 5 MG tablet  Hyperlipidemia - Plan: atorvastatin (LIPITOR) 40 MG tablet, Lipid panel  Gastroesophageal reflux disease without esophagitis  History of gout  Renal stones  Atherosclerosis  Cold intolerance - Plan: TSH  Immunization due - Plan: Pneumococcal conjugate vaccine 13-valent, Flu vaccine HIGH DOSE PF (Fluzone Tri High dose)  Routine general medical examination at a health care facility - Plan: CBC with Differential, Comprehensive metabolic panel  his immunizations were updated. He will followup with his colonoscopy on a routine basis. Discussed use of testosterone to potentially help with the central body fat. Followup pending results of the blood work.

## 2014-01-08 ENCOUNTER — Other Ambulatory Visit: Payer: Self-pay

## 2014-01-08 LAB — COMPREHENSIVE METABOLIC PANEL
ALBUMIN: 4.2 g/dL (ref 3.5–5.2)
ALK PHOS: 80 U/L (ref 39–117)
ALT: 17 U/L (ref 0–53)
AST: 17 U/L (ref 0–37)
BUN: 17 mg/dL (ref 6–23)
CO2: 28 mEq/L (ref 19–32)
CREATININE: 1.21 mg/dL (ref 0.50–1.35)
Calcium: 9.5 mg/dL (ref 8.4–10.5)
Chloride: 102 mEq/L (ref 96–112)
Glucose, Bld: 98 mg/dL (ref 70–99)
POTASSIUM: 4 meq/L (ref 3.5–5.3)
Sodium: 139 mEq/L (ref 135–145)
Total Bilirubin: 0.9 mg/dL (ref 0.2–1.2)
Total Protein: 7.5 g/dL (ref 6.0–8.3)

## 2014-01-08 LAB — LIPID PANEL
CHOL/HDL RATIO: 4 ratio
Cholesterol: 174 mg/dL (ref 0–200)
HDL: 43 mg/dL (ref >39)
LDL CALC: 100 mg/dL — AB (ref 0–99)
TRIGLYCERIDES: 157 mg/dL — AB (ref <150)
VLDL: 31 mg/dL (ref 0–40)

## 2014-01-08 LAB — TSH: TSH: 1.825 u[IU]/mL (ref 0.350–4.500)

## 2014-01-08 LAB — TESTOSTERONE: Testosterone: 215 ng/dL — ABNORMAL LOW (ref 300–890)

## 2014-01-08 NOTE — Addendum Note (Signed)
Addended by: Ronnald NianLALONDE, JOHN C on: 01/08/2014 11:28 AM   Modules accepted: Level of Service

## 2014-01-25 ENCOUNTER — Telehealth: Payer: Self-pay | Admitting: Family Medicine

## 2014-01-25 NOTE — Telephone Encounter (Signed)
Called [facility] [phone] and verified that pt never picked up any of these Rx so I cancelled them & called them into new pharmacy [phone]

## 2014-04-12 ENCOUNTER — Telehealth: Payer: Self-pay | Admitting: Family Medicine

## 2014-04-12 NOTE — Telephone Encounter (Signed)
Emailed lab order to patient per his request

## 2014-04-12 NOTE — Telephone Encounter (Signed)
Take care of this 

## 2014-04-12 NOTE — Telephone Encounter (Signed)
[facility] to [facility] for her to email this to him

## 2014-04-12 NOTE — Telephone Encounter (Signed)
Wants order to have testosterone drawn emailed to him at email address below. He is in [place] traveling and will just go to lab in [place] and have labd drawn     [email or web address]

## 2014-04-13 ENCOUNTER — Other Ambulatory Visit: Payer: Self-pay | Admitting: Family Medicine

## 2014-04-17 ENCOUNTER — Other Ambulatory Visit: Payer: Self-pay

## 2014-04-17 NOTE — Telephone Encounter (Signed)
i could not put order in system custom care 10% cream apply 1 ml daily with 3 refills

## 2014-04-18 ENCOUNTER — Encounter: Payer: Self-pay | Admitting: Family Medicine

## 2014-07-14 ENCOUNTER — Other Ambulatory Visit: Payer: Self-pay | Admitting: Family Medicine

## 2014-07-18 ENCOUNTER — Telehealth: Payer: Self-pay | Admitting: Internal Medicine

## 2014-07-18 DIAGNOSIS — I10 Essential (primary) hypertension: Secondary | ICD-10-CM

## 2014-07-18 MED ORDER — LISINOPRIL-HYDROCHLOROTHIAZIDE 20-12.5 MG PO TABS: 1.0000 | ORAL_TABLET | Freq: Every day | ORAL | Status: DC

## 2014-07-18 NOTE — Telephone Encounter (Signed)
Pt called an needed lisinopril-hctz filled to pharmacy

## 2014-08-20 ENCOUNTER — Telehealth: Payer: Self-pay | Admitting: Family Medicine

## 2014-08-20 NOTE — Telephone Encounter (Signed)
Pt called and requested lab orders to be emailed. He states he needs testosterone labs drawn. He needs written orders. [place] Pt can be reached at [phone].

## 2014-09-09 ENCOUNTER — Other Ambulatory Visit: Payer: Self-pay

## 2014-09-09 MED ORDER — NONFORMULARY OR COMPOUNDED ITEM: Status: AC

## 2014-09-09 MED ORDER — NONFORMULARY OR COMPOUNDED ITEM: Status: DC

## 2015-01-19 ENCOUNTER — Other Ambulatory Visit: Payer: Self-pay | Admitting: Family Medicine

## 2015-01-20 ENCOUNTER — Telehealth: Payer: Self-pay

## 2015-01-20 NOTE — Telephone Encounter (Signed)
I HAVE CALLED PT TO INFORM HIM HE NEEDS AN APPOINTMENT HE HAS NOT BEEN IN A YEAR HE SAID HE WANT BE BACK UNTIL AROUND [place] TIME I TOLD HIM I COULD REFILL TO BEGIN OF YEAR BUT HE HAS TO MAKE APPOINTMENT PT VERBALIZED UNDERSTANDING

## 2015-02-05 ENCOUNTER — Telehealth: Payer: Self-pay | Admitting: Family Medicine

## 2015-02-05 ENCOUNTER — Other Ambulatory Visit: Payer: Self-pay

## 2015-02-05 MED ORDER — PANTOPRAZOLE SODIUM 40 MG PO TBEC: 40.0000 mg | DELAYED_RELEASE_TABLET | Freq: Every day | ORAL | Status: AC

## 2015-02-05 NOTE — Telephone Encounter (Signed)
Pt called and needs refill of [facility] to [facility] [place].  Pt can be reached at [phone].

## 2015-02-05 NOTE — Telephone Encounter (Signed)
Give him a 90 day supply with 3 refills

## 2015-06-25 ENCOUNTER — Other Ambulatory Visit: Payer: Self-pay | Admitting: Family Medicine

## 2015-06-25 NOTE — Telephone Encounter (Signed)
Last OV 01/07/2014  Last filled 01/07/2014

## 2016-02-12 ENCOUNTER — Telehealth: Payer: Self-pay | Admitting: Internal Medicine

## 2016-02-12 NOTE — Telephone Encounter (Signed)
Left vm about pt not being seen since 2015 and he was due for appt if still a pt here and to call back to schedule

## 2021-09-17 ENCOUNTER — Encounter: Payer: Self-pay | Admitting: Family Medicine
